# Patient Record
Sex: Female | Born: 1965 | Hispanic: Yes
Health system: Southern US, Community
[De-identification: ages and names within clinical notes are randomized; demographics above are authoritative.]

## PROBLEM LIST (undated history)

## (undated) DIAGNOSIS — G43909 Migraine, unspecified, not intractable, without status migrainosus: Secondary | ICD-10-CM

---

## 2006-08-14 ENCOUNTER — Emergency Department: Payer: Self-pay | Admitting: Emergency Medicine

## 2008-08-28 IMAGING — CT CT HEAD WITHOUT CONTRAST
2 series · 16 of 30 positions shown, 20 images · non-contrast
Comparison: none

REASON FOR EXAM: Headache for nine days
COMMENTS:

[Series 2: without · axial · non-contrast · 0.39mm/px · z∈[-198,-78]mm · 13 of 29 slices shown, 17 images]
[im 3/29  brain]
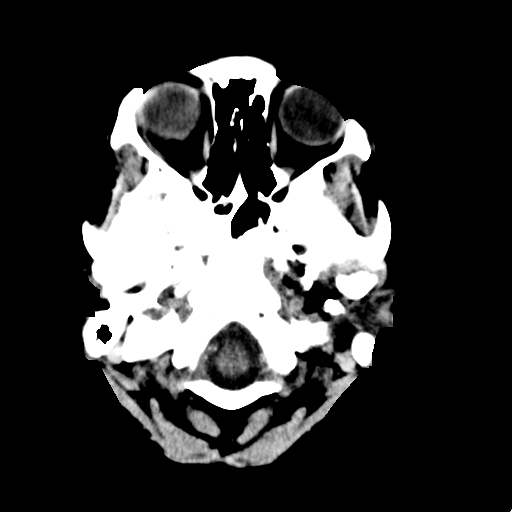
[im 3/29  bone]
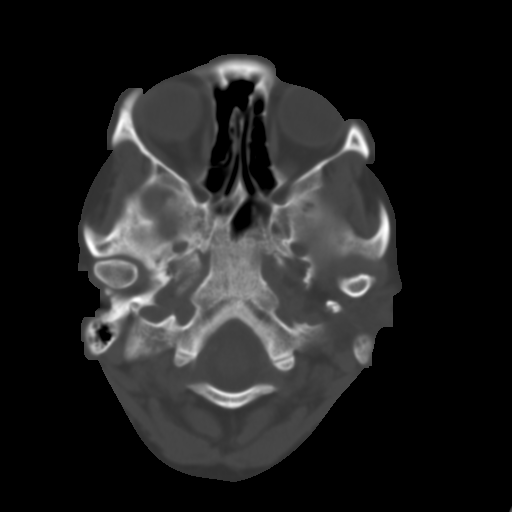
[im 5/29  brain]
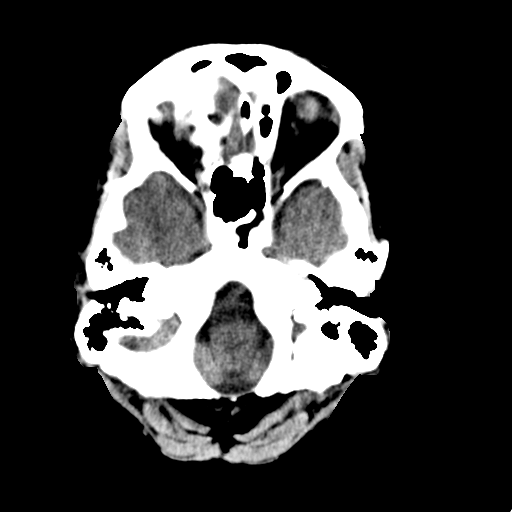
[im 7/29  brain]
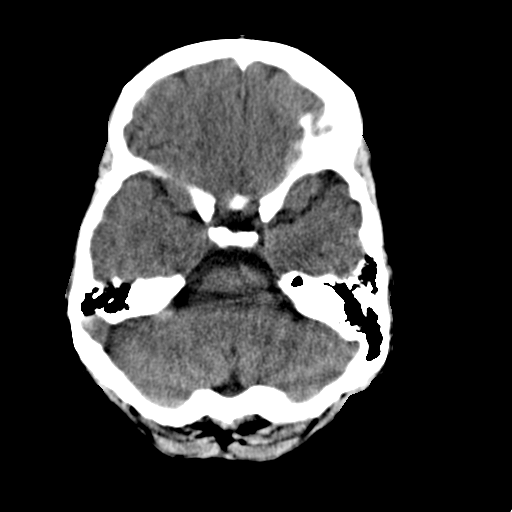
[im 9/29  brain]
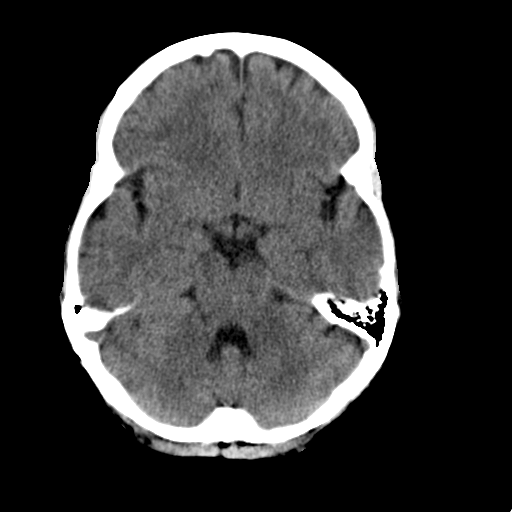
[im 11/29  brain]
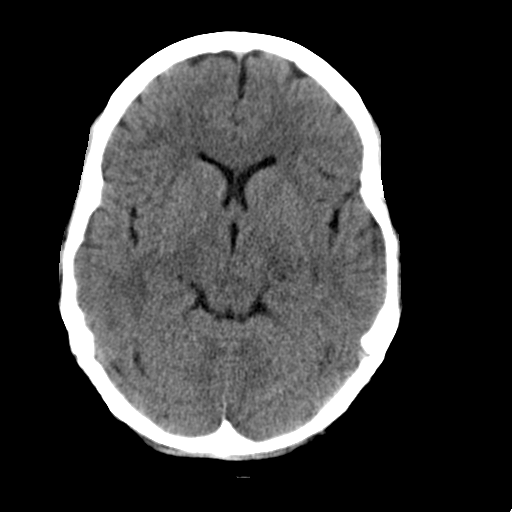
[im 11/29  bone]
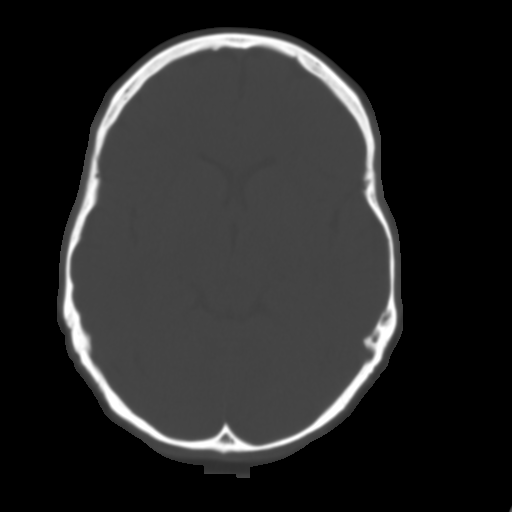
[im 13/29  brain]
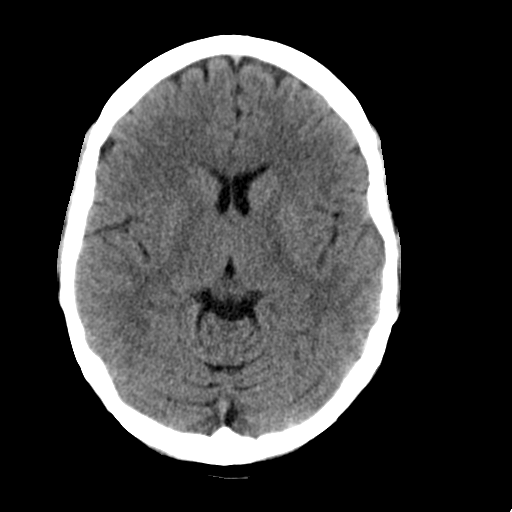
[im 15/29  brain]
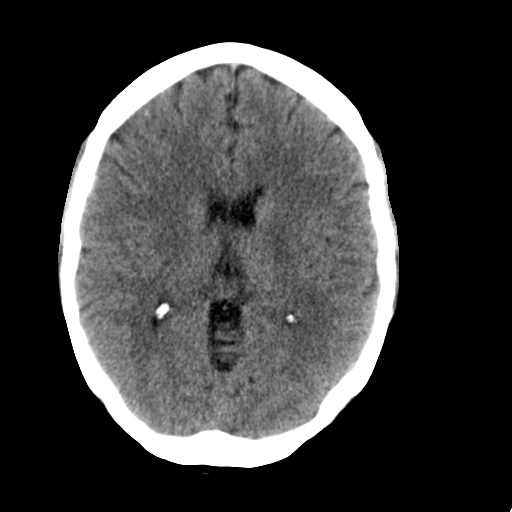
[im 17/29  brain]
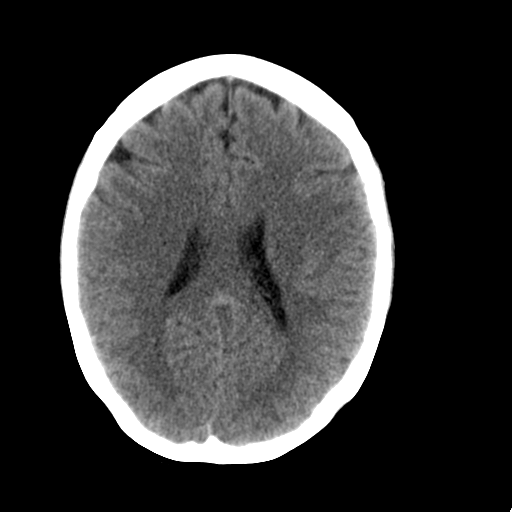
[im 19/29  brain]
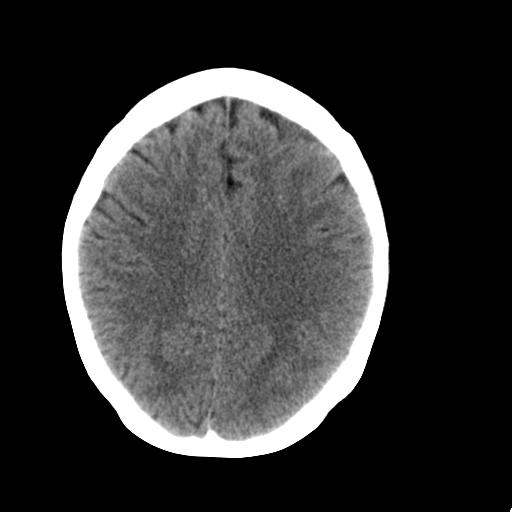
[im 19/29  bone]
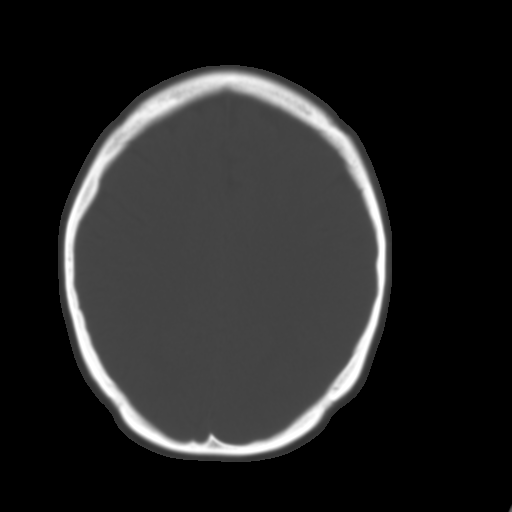
[im 21/29  brain]
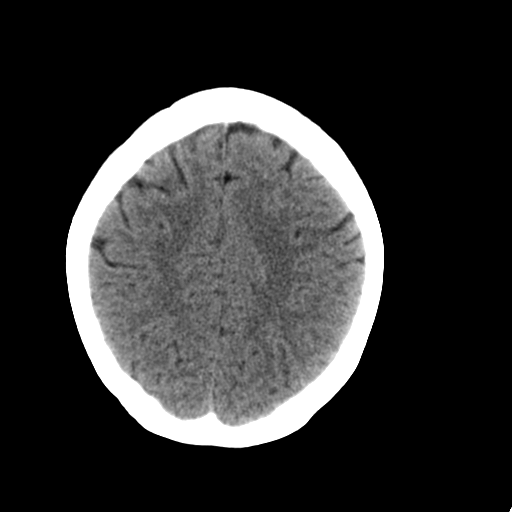
[im 23/29  brain]
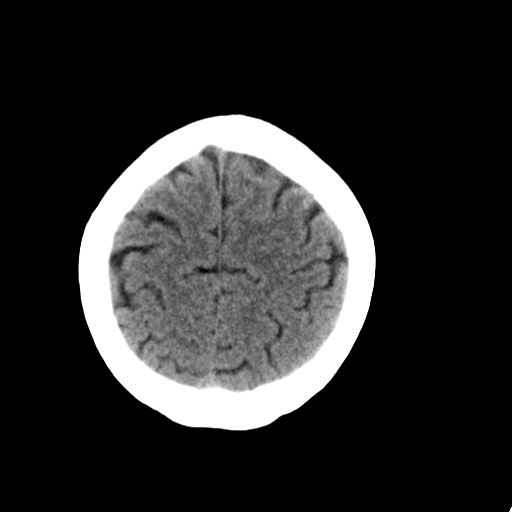
[im 25/29  brain]
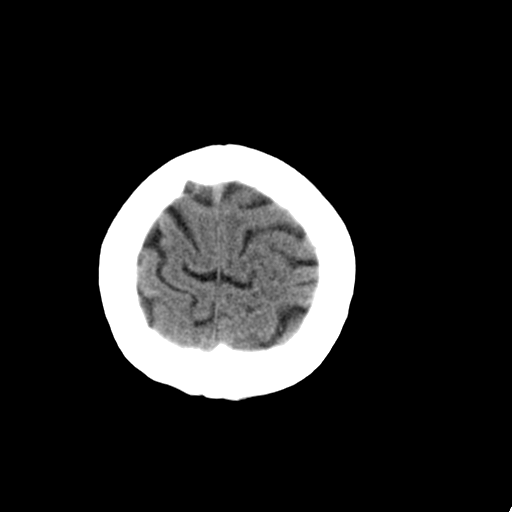
[im 27/29  brain]
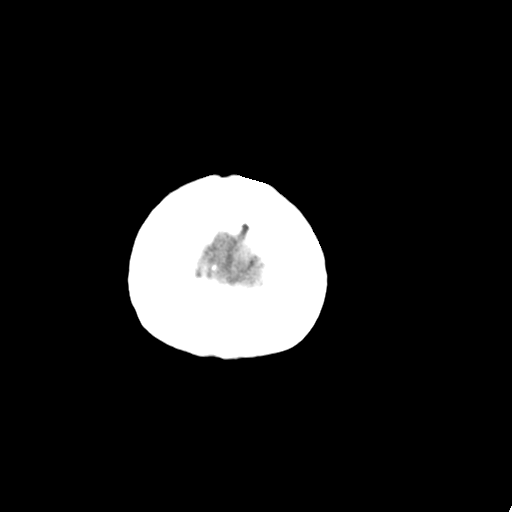
[im 27/29  bone]
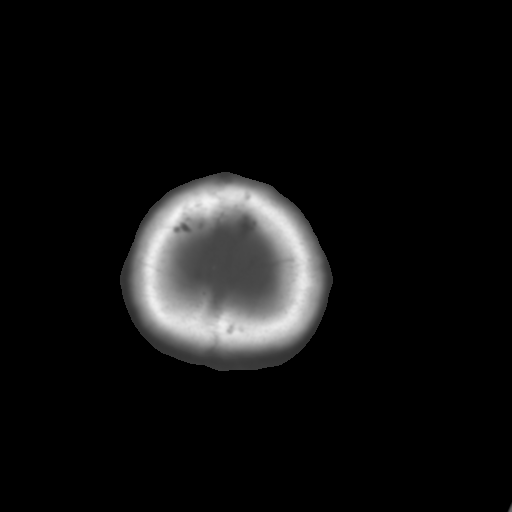

[Series 3: bone · axial · 0.39mm/px · z∈[-198,-158]mm · 3 of 29 slices shown]
[im 3/29  bone]
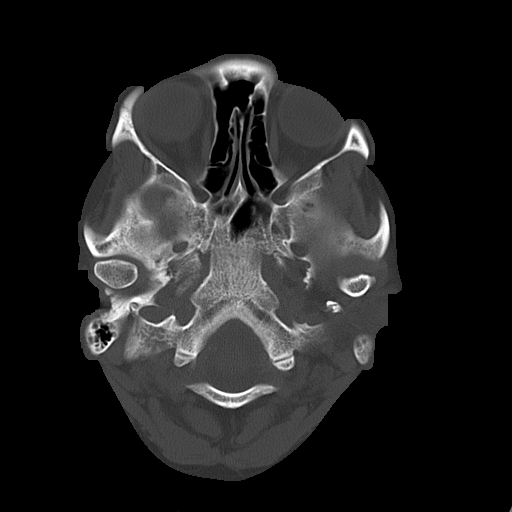
[im 7/29  bone]
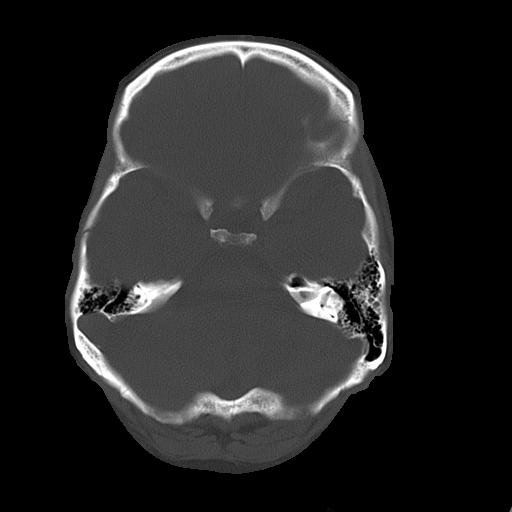
[im 11/29  bone]
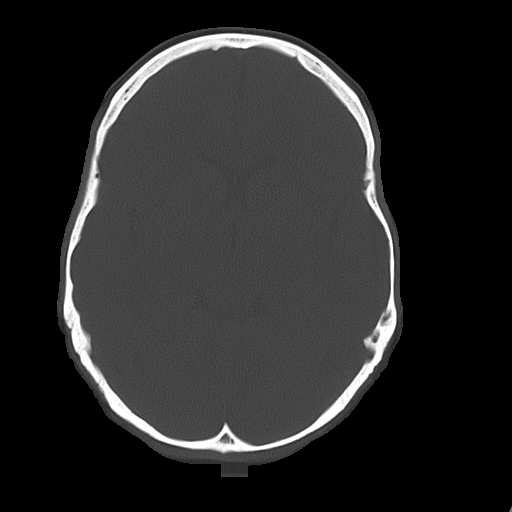

[16 of 30 positions shown; findings below may reference images not displayed]

PROCEDURE:     CT  - CT HEAD WITHOUT CONTRAST  - August 14, 2006 [DATE]

RESULT:     There is no evidence of intra-axial or extra-axial fluid
collections or evidence of acute hemorrhage. No subfalcine or tonsillar
herniation is identified. The visualized bony skeleton demonstrates no
evidence of fracture or dislocation. Minimal air/fluid levels are identified
within the sphenoid air cell on the RIGHT.
IMPRESSION: 1.     No evidence of acute intracranial abnormalities.
2.     Dr. Borai of the Emergency Department was informed of these findings
via a preliminary faxed report on 08/14/2006.

## 2013-11-20 ENCOUNTER — Other Ambulatory Visit (HOSPITAL_COMMUNITY): Payer: Self-pay | Admitting: Family Medicine

## 2013-11-20 DIAGNOSIS — Z1231 Encounter for screening mammogram for malignant neoplasm of breast: Secondary | ICD-10-CM

## 2013-12-11 ENCOUNTER — Ambulatory Visit (HOSPITAL_COMMUNITY): Payer: Self-pay | Attending: *Deleted

## 2013-12-29 ENCOUNTER — Ambulatory Visit (HOSPITAL_COMMUNITY)
Admission: RE | Admit: 2013-12-29 | Discharge: 2013-12-29 | Disposition: A | Discharge status: Home / Self Care | Payer: BC Managed Care – PPO | Source: Ambulatory Visit | Attending: Family Medicine | Admitting: Family Medicine

## 2013-12-29 DIAGNOSIS — Z1231 Encounter for screening mammogram for malignant neoplasm of breast: Secondary | ICD-10-CM | POA: Insufficient documentation

## 2015-11-15 ENCOUNTER — Other Ambulatory Visit: Payer: Self-pay | Admitting: Family

## 2015-11-15 DIAGNOSIS — Z1231 Encounter for screening mammogram for malignant neoplasm of breast: Secondary | ICD-10-CM

## 2018-03-11 DIAGNOSIS — I10 Essential (primary) hypertension: Secondary | ICD-10-CM | POA: Diagnosis not present

## 2018-03-11 DIAGNOSIS — H538 Other visual disturbances: Secondary | ICD-10-CM | POA: Diagnosis not present

## 2018-03-11 DIAGNOSIS — R51 Headache: Secondary | ICD-10-CM | POA: Diagnosis not present

## 2018-03-11 DIAGNOSIS — R42 Dizziness and giddiness: Secondary | ICD-10-CM | POA: Diagnosis present

## 2018-03-12 ENCOUNTER — Emergency Department (HOSPITAL_COMMUNITY)
Admission: EM | Admit: 2018-03-12 | Discharge: 2018-03-12 | Disposition: A | Discharge status: Home / Self Care | Payer: BC Managed Care – PPO | Attending: Emergency Medicine | Admitting: Emergency Medicine

## 2018-03-12 ENCOUNTER — Encounter (HOSPITAL_COMMUNITY): Payer: Self-pay | Admitting: *Deleted

## 2018-03-12 DIAGNOSIS — R42 Dizziness and giddiness: Secondary | ICD-10-CM

## 2018-03-12 HISTORY — DX: Migraine, unspecified, not intractable, without status migrainosus: G43.909

## 2018-03-12 LAB — CBC
HCT: 39.9 % (ref 36.0–46.0)
HEMOGLOBIN: 12.6 g/dL (ref 12.0–15.0)
MCH: 28.6 pg (ref 26.0–34.0)
MCHC: 31.6 g/dL (ref 30.0–36.0)
MCV: 90.7 fL (ref 80.0–100.0)
Platelets: 269 10*3/uL (ref 150–400)
RBC: 4.4 MIL/uL (ref 3.87–5.11)
RDW: 12.1 % (ref 11.5–15.5)
WBC: 6.7 10*3/uL (ref 4.0–10.5)
nRBC: 0 % (ref 0.0–0.2)

## 2018-03-12 LAB — BASIC METABOLIC PANEL
Anion gap: 10 (ref 5–15)
BUN: 11 mg/dL (ref 6–20)
CO2: 24 mmol/L (ref 22–32)
Calcium: 10 mg/dL (ref 8.9–10.3)
Chloride: 105 mmol/L (ref 98–111)
Creatinine, Ser: 0.66 mg/dL (ref 0.44–1.00)
GFR calc Af Amer: >60 mL/min (ref >60)
GFR calc non Af Amer: >60 mL/min (ref >60)
GLUCOSE: 106 mg/dL — AB (ref 70–99)
Potassium: 4.4 mmol/L (ref 3.5–5.1)
Sodium: 139 mmol/L (ref 135–145)

## 2018-03-12 LAB — URINALYSIS, ROUTINE W REFLEX MICROSCOPIC
Bilirubin Urine: NEGATIVE
Glucose, UA: NEGATIVE mg/dL
Ketones, ur: NEGATIVE mg/dL
Nitrite: NEGATIVE
PROTEIN: NEGATIVE mg/dL
Specific Gravity, Urine: 1.003 — ABNORMAL LOW (ref 1.005–1.030)
pH: 7 (ref 5.0–8.0)

## 2018-03-12 LAB — CBG MONITORING, ED: Glucose-Capillary: 110 mg/dL — ABNORMAL HIGH (ref 70–99)

## 2018-03-12 MED ORDER — MECLIZINE HCL 25 MG PO TABS
25.0000 mg | ORAL_TABLET | Freq: Once | ORAL | Status: AC
  Administered 2018-03-12: 25 mg via ORAL
  Filled 2018-03-12: qty 1

## 2018-03-12 MED ORDER — PROCHLORPERAZINE EDISYLATE 10 MG/2ML IJ SOLN
10.0000 mg | Freq: Once | INTRAMUSCULAR | Status: AC
  Administered 2018-03-12: 10 mg via INTRAVENOUS
  Filled 2018-03-12: qty 2

## 2018-03-12 MED ORDER — SODIUM CHLORIDE 0.9 % IV BOLUS
1000.0000 mL | Freq: Once | INTRAVENOUS | Status: AC
  Administered 2018-03-12: 1000 mL via INTRAVENOUS

## 2018-03-12 MED ORDER — MECLIZINE HCL 25 MG PO TABS: 25.0000 mg | ORAL_TABLET | Freq: Three times a day (TID) | ORAL | 0 refills | Status: AC | PRN

## 2018-03-12 MED ORDER — DIPHENHYDRAMINE HCL 50 MG/ML IJ SOLN
25.0000 mg | Freq: Once | INTRAMUSCULAR | Status: AC
  Administered 2018-03-12: 25 mg via INTRAVENOUS
  Filled 2018-03-12: qty 1

## 2018-03-12 MED ORDER — SODIUM CHLORIDE 0.9% FLUSH: 3.0000 mL | Freq: Once | INTRAVENOUS | Status: DC

## 2018-03-12 NOTE — Discharge Instructions (Addendum)
Thank you for allowing me to care for you today in the Emergency Department.   You may take 1 tablet of meclizine by mouth every 8 hours as needed for dizziness.  Please follow-up with your primary care provider for a recheck of your symptoms in 2 to 3 days.  Return to the emergency department if you develop constant dizziness with sudden onset headache, loss of vision in one or both eyes, new numbness or weakness, vomiting, or other new, concerning symptoms.

## 2018-03-12 NOTE — ED Notes (Addendum)
Pt ambulated in hallway. Pt stable and denies dizziness. EDP notified.

## 2018-03-12 NOTE — ED Triage Notes (Signed)
To ED for eval of sudden dizziness and confusion that started 10pm after argument with son. Pt speech is clear. No vomiting. Face symmetrical. Grips strong and equal. States she has hx of migraines and has had one for past 3 days. HA feels the same

## 2018-03-12 NOTE — ED Notes (Signed)
Patient verbalizes understanding of discharge instructions. Opportunity for questioning and answers were provided. Armband removed by staff, pt discharged from ED in wheelchair.  

## 2018-03-12 NOTE — ED Provider Notes (Signed)
[facility] EMERGENCY DEPARTMENT Provider Note   CSN: [id number] Arrival date & time: 03/11/18  2355    History   Chief Complaint Chief Complaint  Patient presents with  . Dizziness  . Altered Mental Status    HPI Sallyanne Birkhead is a 53 y.o. female of migraines and HTN who presents to the emergency department with a chief complaint of dizziness.  The patient endorses 2 episodes of intermittent dizziness, characterized as room spinning, that began at approximately 10 PM.  She reports dizziness was worse with closing her eyes to pray and when she was sitting on the toilet.  Patient's daughter also reports that she felt dizzy when she walked to the restroom.  No dizziness at this time.  The patient states the first episode began approximately 10 minutes after getting into an argument with her son.  She reports that she has had multiple other arguments with him in the past with no onset of dizziness afterwards.  However, she reports tonight's argument was more heated.    Per triage note, it states the patient was confused following the argument.  Per the patient's daughter, she states that the patient was confused about what medication she had taken for her symptoms and what dosing was she was speaking with her mother over the phone.  She denies confusion at this time.  She reports associated "pinching" pain to the right head that seems to radiate down the right side of her neck.  She reports that she took 400 mg of ibuprofen prior to arrival and the pinching pain has since resolved.  She reports a longstanding history of migraines and has been on multiple different medications that have never improved her headaches and does not currently taking any medications.  She also reports that she has had a right-sided headache with intermittent blurred vision that feels similar to her chronic migraines for the last 3 days.  She denies chest pain, shortness of breath, falls,  seizure-like activity, syncope, numbness, weakness, slurred speech, facial drooping, hearing loss, tinnitus, nausea, vomiting, slurred speech, double vision, fever, chills, or neck stiffness.  No urinary symptoms including dysuria, hematuria, urinary frequency or hesitancy.  She reports she did not eat dinner prior to the onset of her symptoms.  She reports that she was also active and cleaning around the house for most of the day.       The history is provided by the patient. No language interpreter was used.    Past Medical History:  Diagnosis Date  . Migraine     There are no active problems to display for this patient.   History reviewed. No pertinent surgical history.   OB History   No obstetric history on file.      Home Medications    Prior to Admission medications   Medication Sig Start Date End Date Taking? Authorizing Provider  ibuprofen (ADVIL,MOTRIN) 800 MG tablet Take 400 mg by mouth every 8 (eight) hours as needed for headache.   Yes [provider]  meclizine (ANTIVERT) 25 MG tablet Take 1 tablet (25 mg total) by mouth 3 (three) times daily as needed for dizziness. 03/12/18   Tylah Mancillas A, PA-C    Family History No family history on file.  Social History Social History   Tobacco Use  . Smoking status: Not on file  Substance Use Topics  . Alcohol use: Not on file  . Drug use: Not on file     Allergies   Patient  has no known allergies.   Review of Systems Review of Systems  Constitutional: Negative for activity change, chills and fever.  HENT: Negative for congestion.   Eyes: Positive for visual disturbance (blurred vision; resolved).  Respiratory: Negative for shortness of breath.   Cardiovascular: Negative for chest pain.  Gastrointestinal: Negative for abdominal pain, diarrhea, nausea and vomiting.  Genitourinary: Negative for dysuria.  Musculoskeletal: Negative for arthralgias, back pain, myalgias, neck pain and neck stiffness.    Skin: Negative for rash.  Allergic/Immunologic: Negative for immunocompromised state.  Neurological: Positive for dizziness and headaches. Negative for seizures, syncope, weakness, light-headedness and numbness.  Psychiatric/Behavioral: Negative for confusion.   Physical Exam Updated Vital Signs BP 118/68 (BP Location: Right Arm)   Pulse 72   Temp 98.6 F (37 C) (Oral)   Resp 16   SpO2 100%   Physical Exam Vitals signs and nursing note reviewed.  Constitutional:      General: She is not in acute distress. HENT:     Head: Normocephalic.  Eyes:     Extraocular Movements: Extraocular movements intact.     Conjunctiva/sclera: Conjunctivae normal.     Pupils: Pupils are equal, round, and reactive to light.  Neck:     Musculoskeletal: Normal range of motion and neck supple. No neck rigidity or muscular tenderness.  Cardiovascular:     Rate and Rhythm: Normal rate and regular rhythm.     Pulses: Normal pulses.     Heart sounds: Normal heart sounds. No murmur. No friction rub. No gallop.   Pulmonary:     Effort: Pulmonary effort is normal. No respiratory distress.     Breath sounds: Normal breath sounds. No stridor. No wheezing, rhonchi or rales.  Chest:     Chest wall: No tenderness.  Abdominal:     General: There is no distension.     Palpations: Abdomen is soft. There is no mass.     Tenderness: There is no abdominal tenderness. There is no right CVA tenderness, left CVA tenderness, guarding or rebound.     Hernia: No hernia is present.  Skin:    General: Skin is warm.     Findings: No rash.  Neurological:     Mental Status: She is alert.     Comments: Cranial nerves II through XII are grossly intact.  5-5 strength against resistance of the bilateral upper and lower extremities.  Normal gait.  Finger-to-nose is intact bilaterally.  Negative pronator drift.  Negative Romberg.  Sensation is intact and equal throughout.  Psychiatric:        Behavior: Behavior normal.       ED Treatments / Results  Labs (all labs ordered are listed, but only abnormal results are displayed) Labs Reviewed  BASIC METABOLIC PANEL - Abnormal; Notable for the following components:      Result Value   Glucose, Bld 106 (*)    All other components within normal limits  URINALYSIS, ROUTINE W REFLEX MICROSCOPIC - Abnormal; Notable for the following components:   Color, Urine COLORLESS (*)    Specific Gravity, Urine 1.003 (*)    Hgb urine dipstick MODERATE (*)    Leukocytes,Ua MODERATE (*)    Bacteria, UA RARE (*)    All other components within normal limits  CBG MONITORING, ED - Abnormal; Notable for the following components:   Glucose-Capillary 110 (*)    All other components within normal limits  CBC    EKG EKG Interpretation  Date/Time:  Wednesday March 12 2018 00:07:43  EST Ventricular Rate:  70 PR Interval:  150 QRS Duration: 78 QT Interval:  366 QTC Calculation: 395 R Axis:   68 Text Interpretation:  Normal sinus rhythm Normal ECG No old tracing to compare Confirmed by Ward, Baxter Hire [phone]) on 03/12/2018 12:18:05 AM   Radiology No results found.  Procedures Procedures (including critical care time)  Medications Ordered in ED Medications  sodium chloride flush (NS) 0.9 % injection 3 mL (has no administration in time range)  meclizine (ANTIVERT) tablet 25 mg (25 mg Oral Given 03/12/18 0100)  prochlorperazine (COMPAZINE) injection 10 mg (10 mg Intravenous Given 03/12/18 0106)  diphenhydrAMINE (BENADRYL) injection 25 mg (25 mg Intravenous Given 03/12/18 0106)  sodium chloride 0.9 % bolus 1,000 mL (0 mLs Intravenous Stopped 03/12/18 0222)     Initial Impression / Assessment and Plan / ED Course  I have reviewed the triage vital signs and the nursing notes.  Pertinent labs & imaging results that were available during my care of the patient were reviewed by me and considered in my medical decision making (see chart for details).  53 year old female with  a history of hypertension and migraines presenting with dizziness that is been intermittent.  Symptoms began tonight after a verbal argument with her son.  She reports that she has not been eating or sleeping well over the last few days.  No focal deficits on neurologic exam.  Physical exam is otherwise unremarkable.  UA with moderate hemoglobinuria and positive for leukocyte esterase.  She is having no urinary symptoms so will not treat at this time.  Labs are otherwise reassuring.  EKG with normal sinus rhythm.  Will give migraine cocktail and meclizine and reassess.  Clinical Course as of Mar 12 234  Wed Mar 12, 2018  0146 Patient recheck. She reports improvement in her headache. Will finish fluid bolus and plan to ambulate the patient. She denies urinary symptoms.    [MM]    Clinical Course User Index [MM] Christop Hippert A, PA-C   On second reassessment, patient reports significant improvement in her symptoms.  She has no dizziness.  She reports initial dizziness with ambulation earlier in her ER visit, which is since resolved.  Low suspicion for central vertigo, BPPV, meningitis, SAH, Mnire's, or temporal arteritis.  Will discharge with meclizine.  Pt is to follow up with PCP to discuss prophylactic medication.  Strict return precautions given.  She is hemodynamically stable and in no acute distress.  Pt verbalizes understanding and is agreeable with plan to dc.     Final Clinical Impressions(s) / ED Diagnoses   Final diagnoses:  Vertigo    ED Discharge Orders         Ordered    meclizine (ANTIVERT) 25 MG tablet  3 times daily PRN     03/12/18 0229           Alanie Syler, Pedro Earls A, PA-C 03/12/18 0236    Ward, Layla Maw, DO 03/12/18 0425

## 2020-09-10 LAB — COLOGUARD: COLOGUARD: NEGATIVE

## 2023-09-18 ENCOUNTER — Encounter (HOSPITAL_COMMUNITY): Payer: Self-pay

## 2023-09-18 ENCOUNTER — Emergency Department (HOSPITAL_COMMUNITY)
Admission: EM | Admit: 2023-09-18 | Discharge: 2023-09-18 | Disposition: A | Discharge status: Home / Self Care | Source: Ambulatory Visit | Attending: Emergency Medicine | Admitting: Emergency Medicine

## 2023-09-18 ENCOUNTER — Other Ambulatory Visit: Payer: Self-pay

## 2023-09-18 ENCOUNTER — Emergency Department (HOSPITAL_COMMUNITY)

## 2023-09-18 DIAGNOSIS — R1011 Right upper quadrant pain: Secondary | ICD-10-CM | POA: Diagnosis present

## 2023-09-18 DIAGNOSIS — R110 Nausea: Secondary | ICD-10-CM | POA: Diagnosis not present

## 2023-09-18 LAB — COMPREHENSIVE METABOLIC PANEL WITH GFR
ALT: 26 U/L (ref 0–44)
AST: 27 U/L (ref 15–41)
Albumin: 4.6 g/dL (ref 3.5–5.0)
Alkaline Phosphatase: 77 U/L (ref 38–126)
Anion gap: 10 (ref 5–15)
BUN: 13 mg/dL (ref 6–20)
CO2: 25 mmol/L (ref 22–32)
Calcium: 9.7 mg/dL (ref 8.9–10.3)
Chloride: 105 mmol/L (ref 98–111)
Creatinine, Ser: 0.61 mg/dL (ref 0.44–1.00)
GFR, Estimated: >60 mL/min (ref >60)
Glucose, Bld: 98 mg/dL (ref 70–99)
Potassium: 4.2 mmol/L (ref 3.5–5.1)
Sodium: 140 mmol/L (ref 135–145)
Total Bilirubin: 0.3 mg/dL (ref 0.0–1.2)
Total Protein: 7.6 g/dL (ref 6.5–8.1)

## 2023-09-18 LAB — URINALYSIS, ROUTINE W REFLEX MICROSCOPIC
Bilirubin Urine: NEGATIVE
Glucose, UA: NEGATIVE mg/dL
Ketones, ur: NEGATIVE mg/dL
Nitrite: NEGATIVE
Protein, ur: NEGATIVE mg/dL
Specific Gravity, Urine: 1.003 — ABNORMAL LOW (ref 1.005–1.030)
pH: 7 (ref 5.0–8.0)

## 2023-09-18 LAB — CBC
HCT: 40.2 % (ref 36.0–46.0)
Hemoglobin: 13 g/dL (ref 12.0–15.0)
MCH: 29 pg (ref 26.0–34.0)
MCHC: 32.3 g/dL (ref 30.0–36.0)
MCV: 89.5 fL (ref 80.0–100.0)
Platelets: 287 K/uL (ref 150–400)
RBC: 4.49 MIL/uL (ref 3.87–5.11)
RDW: 12.9 % (ref 11.5–15.5)
WBC: 6.3 K/uL (ref 4.0–10.5)
nRBC: 0 % (ref 0.0–0.2)

## 2023-09-18 LAB — LIPASE, BLOOD: Lipase: 54 U/L — ABNORMAL HIGH (ref 11–51)

## 2023-09-18 MED ORDER — MORPHINE SULFATE (PF) 4 MG/ML IV SOLN
4.0000 mg | Freq: Once | INTRAVENOUS | Status: AC
  Administered 2023-09-18: 4 mg via INTRAVENOUS
  Filled 2023-09-18: qty 1

## 2023-09-18 MED ORDER — ONDANSETRON HCL 4 MG/2ML IJ SOLN
4.0000 mg | Freq: Once | INTRAMUSCULAR | Status: AC
  Administered 2023-09-18: 4 mg via INTRAVENOUS
  Filled 2023-09-18: qty 2

## 2023-09-18 NOTE — Discharge Instructions (Signed)
 Evaluate was overall reassuring.  Please follow-up with your PCP for your ongoing right upper quadrant pain.  As we discussed there was a pulmonary nodule discovered on your CT.  This is an incidental finding.  The ultrasound did show what appears to be a gallbladder polyp or biliary sludge.  Nothing to do for these findings at this time.  If you develop worsening right upper quadrant pain with nausea vomiting diarrhea, yellowing of the skin, fever or any other concerning symptom please return to ED for further evaluation.

## 2023-09-18 NOTE — ED Provider Notes (Cosign Needed Addendum)
 [place] EMERGENCY DEPARTMENT AT [facility] Provider Note   CSN: [id number] Arrival date & time: 09/18/23  9071     Patient presents with: Abdominal Pain  HPI Stacey Henderson is a 58 y.o. female presenting for right upper quadrant abdominal pain.  Has been going on intermittently for about a year but worse in the last couple nights.  Actually woke her up from sleep this morning.  Endorses some nausea but no GI or urinary symptoms.  Was seen by her PCP today and sent here for ultrasound concern for issue with the gallbladder.  Denies worsening pain with meals.  Denies chest pain or shortness of breath.    Abdominal Pain      Prior to Admission medications   Medication Sig Start Date End Date Taking? Authorizing Provider  ibuprofen (ADVIL,MOTRIN) 800 MG tablet Take 400 mg by mouth every 8 (eight) hours as needed for headache.    [provider]  meclizine  (ANTIVERT ) 25 MG tablet Take 1 tablet (25 mg total) by mouth 3 (three) times daily as needed for dizziness. 03/12/18   McDonald, Mia A, PA-C    Allergies: Patient has no known allergies.    Review of Systems  Gastrointestinal:  Positive for abdominal pain.    Updated Vital Signs BP (!) 152/86 (BP Location: Right Arm)   Pulse 63   Temp 98.6 F (37 C) (Oral)   Resp 17   SpO2 100%   Physical Exam Vitals and nursing note reviewed.  HENT:     Head: Normocephalic and atraumatic.     Mouth/Throat:     Mouth: Mucous membranes are moist.  Eyes:     General:        Right eye: No discharge.        Left eye: No discharge.     Conjunctiva/sclera: Conjunctivae normal.  Cardiovascular:     Rate and Rhythm: Normal rate and regular rhythm.     Pulses: Normal pulses.     Heart sounds: Normal heart sounds.  Pulmonary:     Effort: Pulmonary effort is normal.     Breath sounds: Normal breath sounds.  Abdominal:     General: Abdomen is flat. There is no distension.     Palpations: Abdomen is soft.      Tenderness: There is abdominal tenderness in the right upper quadrant. There is no right CVA tenderness.  Skin:    General: Skin is warm and dry.  Neurological:     General: No focal deficit present.  Psychiatric:        Mood and Affect: Mood normal.     (all labs ordered are listed, but only abnormal results are displayed) Labs Reviewed  LIPASE, BLOOD - Abnormal; Notable for the following components:      Result Value   Lipase 54 (*)    All other components within normal limits  URINALYSIS, ROUTINE W REFLEX MICROSCOPIC - Abnormal; Notable for the following components:   Color, Urine COLORLESS (*)    Specific Gravity, Urine 1.003 (*)    Hgb urine dipstick MODERATE (*)    Leukocytes,Ua MODERATE (*)    Bacteria, UA RARE (*)    All other components within normal limits  COMPREHENSIVE METABOLIC PANEL WITH GFR  CBC    EKG: None  Radiology: CT Renal Stone Study Result Date: 09/18/2023 CLINICAL DATA:  Right upper abdominal pain over the last 2 nights EXAM: CT ABDOMEN AND PELVIS WITHOUT CONTRAST TECHNIQUE: Multidetector CT imaging of the abdomen and  pelvis was performed following the standard protocol without IV contrast. RADIATION DOSE REDUCTION: This exam was performed according to the departmental dose-optimization program which includes automated exposure control, adjustment of the mA and/or kV according to patient size and/or use of iterative reconstruction technique. COMPARISON:  Ultrasound 09/18/2023 FINDINGS: Lower chest: 4 mm in diameter right middle lobe subpleural nodule, image 15 series 10. Lesser subpleural nodularity in both lower lobes. Hepatobiliary: Nonspecific hypodense lateral segment left hepatic lobe lesion measuring 0.6 cm in diameter on image 20 series 2. This lesion is sharply defined and statistically most likely to be a cyst or similar benign lesion. Gallbladder unremarkable in noncontrast CT appearance. Pancreas: Unremarkable Spleen: Unremarkable Adrenals/Urinary  Tract: Unremarkable Stomach/Bowel: Unremarkable.  Normal appendix. Vascular/Lymphatic: Unremarkable Reproductive: Unremarkable Other: No supplemental non-categorized findings. Musculoskeletal: Mild spondylosis and degenerative disc disease at L5-S1 without impingement. IMPRESSION: 1. A specific cause for the patient's right upper quadrant abdominal pain is not identified. 2. 4 mm in diameter right middle lobe subpleural nodule. No follow-up needed if patient is low-risk.This recommendation follows the consensus statement: Guidelines for Management of Incidental Pulmonary Nodules Detected on CT Images: From the Fleischner Society 2017; Radiology 2017; 284:228-243. 3. Mild spondylosis and degenerative disc disease at L5-S1 without impingement. 4. Nonspecific fluid density lateral segment left hepatic lobe lesion measuring 0.6 cm in diameter. This lesion is sharply defined and statistically most likely to be a cyst or similar benign lesion. In the absence of abnormal liver enzymes or history of gastrointestinal malignancy, no further imaging workup of this lesion is indicated. Electronically Signed   By: Ryan Salvage M.D.   On: 09/18/2023 12:05   US  Abdomen Limited RUQ (LIVER/GB) Result Date: 09/18/2023 CLINICAL DATA:  151471 RUQ pain 151471 EXAM: ULTRASOUND ABDOMEN LIMITED RIGHT UPPER QUADRANT COMPARISON:  None Available. FINDINGS: Gallbladder: No gallstones. No wall thickening or pericholecystic fluid. No sonographic Murphy's sign noted by sonographer. Small, nonshadowing echogenicity along the dependent gallbladder wall measuring 3.3 mm. Common bile duct: Diameter: 2 mm Liver: Normal echogenicity. No focal lesion identified. No intrahepatic biliary ductal dilation. Portal vein is patent on color Doppler imaging with normal direction of blood flow towards the liver. Right Kidney: Partially visualized. No mass. No hydronephrosis or nephrolithiasis. Other: None. IMPRESSION: 1. No cholecystolithiasis or changes  of acute cholecystitis. 2. Small, nonshadowing echogenicity along the gallbladder wall, measuring 3.3 mm. This may represent adherent biliary sludge versus a small gallbladder polyp. Follow-up may be considered, as documented below. Polyp measuring equal to or less than 0.5 cm: -No risk factors for gallbladder malignancy: No follow-up required. -Risk factors for gallbladder malignancy: Follow-up ultrasound at 6 months, then annually for 2 years. Follow-up should be discontinued after 2 years, in the absence of growth. RECOMMENDATIONS:As per the 2021 joint European Society guidelines, suggested management of gallbladder polyp(s) are as follows: *NB: Risk factors for gallbladder malignancy include: > 85 years old, history of underlying primary sclerosing cholangitis, Asian ethnicity, sessile polyp (including focal wall thickening > 0.4 cm). Electronically Signed   By: Rogelia Myers M.D.   On: 09/18/2023 10:51     Procedures   Medications Ordered in the ED  morphine  (PF) 4 MG/ML injection 4 mg (4 mg Intravenous Given 09/18/23 1103)  ondansetron  (ZOFRAN ) injection 4 mg (4 mg Intravenous Given 09/18/23 1102)                                    Medical  Decision Making Amount and/or Complexity of Data Reviewed Labs: ordered. Radiology: ordered.  Risk Prescription drug management.   Initial Impression and Ddx 57 year old well-appearing female presenting for right upper quadrant abdominal pain.  Exam notable for right upper quadrant tenderness.  DDx includes acute cholecystitis, acute pancreatitis, kidney stone, pyelonephritis, less likely ACS, PE, other. Patient PMH that increases complexity of ED encounter:  none  Interpretation of Diagnostics - I independent reviewed and interpreted the labs as followed: Mildly elevated lipase 54, hematuria and pyuria  - I independently visualized the following imaging with scope of interpretation limited to determining acute life threatening conditions related  to emergency care: RUQ US , which revealed findings consistent with biliary sludge versus small gallbladder polyp.  CT renal without acute findings but did show a pulmonary nodule.  Shared findings with patient.  Patient Reassessment and Ultimate Disposition/Management Workup largely reassuring.  Pain improved after treatment.  Discussed the imaging findings but ultimately advised her to follow-up with her PCP.  After evaluation, feel she is safe for discharge.  Discussed return precautions.  Sent Zofran  to her pharmacy for the nausea.  Advised Tylenol and ibuprofen for pain at home.  Discharged good condition.  Also UA was somewhat concerning for UTI but given lack of symptoms and no evidence of kidney stone or renal obstruction on imaging, elected not to treat and advised her to follow-up with her PCP should she develop dysuria.  Patient management required discussion with the following services or consulting groups:  None  Complexity of Problems Addressed Acute complicated illness or Injury  Additional Data Reviewed and Analyzed Further history obtained from: Past medical history and medications listed in the EMR and Prior ED visit notes  Patient Encounter Risk Assessment Consideration of hospitalization      Final diagnoses:  RUQ abdominal pain    ED Discharge Orders     None         Arielle Eber K, PA-C 09/18/23 1235    Bernard Drivers, MD 09/19/23 1011

## 2023-09-18 NOTE — ED Triage Notes (Signed)
 Right upper abdominal pain for the past few nights. Pt went to her PCP today for evaluation and was sent here for a US . Pt states she has has some nausea.
